# Patient Record
Sex: Female | Born: 1995
Health system: Southern US, Community
[De-identification: ages and names within clinical notes are randomized; demographics above are authoritative.]

## PROBLEM LIST (undated history)

## (undated) DIAGNOSIS — B029 Zoster without complications: Secondary | ICD-10-CM

## (undated) DIAGNOSIS — Y9321 Activity, ice skating: Secondary | ICD-10-CM

## (undated) DIAGNOSIS — D649 Anemia, unspecified: Secondary | ICD-10-CM

## (undated) HISTORY — DX: Zoster without complications: B02.9

## (undated) HISTORY — DX: Activity, ice skating: Y93.21

---

## 2017-03-20 ENCOUNTER — Ambulatory Visit (INDEPENDENT_AMBULATORY_CARE_PROVIDER_SITE_OTHER): Payer: BLUE CROSS/BLUE SHIELD

## 2017-03-20 ENCOUNTER — Ambulatory Visit (INDEPENDENT_AMBULATORY_CARE_PROVIDER_SITE_OTHER)
Admission: EM | Admit: 2017-03-20 | Discharge: 2017-03-20 | Disposition: A | Discharge status: Home / Self Care | Payer: BLUE CROSS/BLUE SHIELD | Source: Home / Self Care

## 2017-03-20 ENCOUNTER — Emergency Department (HOSPITAL_COMMUNITY): Payer: BLUE CROSS/BLUE SHIELD

## 2017-03-20 ENCOUNTER — Encounter (HOSPITAL_COMMUNITY): Payer: Self-pay | Admitting: Emergency Medicine

## 2017-03-20 ENCOUNTER — Encounter (HOSPITAL_COMMUNITY): Payer: Self-pay | Admitting: *Deleted

## 2017-03-20 ENCOUNTER — Emergency Department (HOSPITAL_COMMUNITY)
Admission: EM | Admit: 2017-03-20 | Discharge: 2017-03-21 | Disposition: A | Discharge status: Home / Self Care | Payer: BLUE CROSS/BLUE SHIELD | Attending: Emergency Medicine | Admitting: Emergency Medicine

## 2017-03-20 DIAGNOSIS — R109 Unspecified abdominal pain: Secondary | ICD-10-CM

## 2017-03-20 DIAGNOSIS — R110 Nausea: Secondary | ICD-10-CM | POA: Diagnosis not present

## 2017-03-20 DIAGNOSIS — R079 Chest pain, unspecified: Secondary | ICD-10-CM

## 2017-03-20 DIAGNOSIS — R0781 Pleurodynia: Secondary | ICD-10-CM | POA: Diagnosis not present

## 2017-03-20 DIAGNOSIS — N12 Tubulo-interstitial nephritis, not specified as acute or chronic: Secondary | ICD-10-CM

## 2017-03-20 DIAGNOSIS — R000 Tachycardia, unspecified: Secondary | ICD-10-CM

## 2017-03-20 DIAGNOSIS — E876 Hypokalemia: Secondary | ICD-10-CM | POA: Insufficient documentation

## 2017-03-20 DIAGNOSIS — R51 Headache: Secondary | ICD-10-CM | POA: Diagnosis not present

## 2017-03-20 DIAGNOSIS — M545 Low back pain: Secondary | ICD-10-CM | POA: Insufficient documentation

## 2017-03-20 DIAGNOSIS — D649 Anemia, unspecified: Secondary | ICD-10-CM | POA: Diagnosis not present

## 2017-03-20 DIAGNOSIS — R519 Headache, unspecified: Secondary | ICD-10-CM

## 2017-03-20 DIAGNOSIS — R1032 Left lower quadrant pain: Secondary | ICD-10-CM | POA: Diagnosis present

## 2017-03-20 HISTORY — DX: Anemia, unspecified: D64.9

## 2017-03-20 LAB — CBC WITH DIFFERENTIAL/PLATELET
BASOS ABS: 0 10*3/uL (ref 0.0–0.1)
BASOS PCT: 0 %
Eosinophils Absolute: 0 10*3/uL (ref 0.0–0.7)
Eosinophils Relative: 0 %
HEMATOCRIT: 36.8 % (ref 36.0–46.0)
HEMOGLOBIN: 12.1 g/dL (ref 12.0–15.0)
LYMPHS PCT: 7 %
Lymphs Abs: 1.3 10*3/uL (ref 0.7–4.0)
MCH: 27.9 pg (ref 26.0–34.0)
MCHC: 32.9 g/dL (ref 30.0–36.0)
MCV: 84.8 fL (ref 78.0–100.0)
Monocytes Absolute: 0.7 10*3/uL (ref 0.1–1.0)
Monocytes Relative: 4 %
NEUTROS ABS: 16.2 10*3/uL — AB (ref 1.7–7.7)
NEUTROS PCT: 89 %
Platelets: 206 10*3/uL (ref 150–400)
RBC: 4.34 MIL/uL (ref 3.87–5.11)
RDW: 13.8 % (ref 11.5–15.5)
WBC: 18.2 10*3/uL — ABNORMAL HIGH (ref 4.0–10.5)

## 2017-03-20 LAB — COMPREHENSIVE METABOLIC PANEL
ALBUMIN: 3.9 g/dL (ref 3.5–5.0)
ALK PHOS: 86 U/L (ref 38–126)
ALT: 16 U/L (ref 14–54)
ANION GAP: 10 (ref 5–15)
AST: 22 U/L (ref 15–41)
BUN: 5 mg/dL — ABNORMAL LOW (ref 6–20)
CALCIUM: 8.9 mg/dL (ref 8.9–10.3)
CO2: 24 mmol/L (ref 22–32)
Chloride: 100 mmol/L — ABNORMAL LOW (ref 101–111)
Creatinine, Ser: 0.73 mg/dL (ref 0.44–1.00)
GFR calc non Af Amer: >60 mL/min (ref >60)
Glucose, Bld: 125 mg/dL — ABNORMAL HIGH (ref 65–99)
POTASSIUM: 3 mmol/L — AB (ref 3.5–5.1)
SODIUM: 134 mmol/L — AB (ref 135–145)
TOTAL PROTEIN: 7.4 g/dL (ref 6.5–8.1)
Total Bilirubin: 0.8 mg/dL (ref 0.3–1.2)

## 2017-03-20 LAB — URINALYSIS, ROUTINE W REFLEX MICROSCOPIC
BILIRUBIN URINE: NEGATIVE
GLUCOSE, UA: NEGATIVE mg/dL
KETONES UR: 5 mg/dL — AB
NITRITE: NEGATIVE
PROTEIN: 100 mg/dL — AB
Specific Gravity, Urine: 1.021 (ref 1.005–1.030)
pH: 5 (ref 5.0–8.0)

## 2017-03-20 LAB — I-STAT BETA HCG BLOOD, ED (MC, WL, AP ONLY): I-stat hCG, quantitative: <5 m[IU]/mL (ref <5)

## 2017-03-20 LAB — I-STAT CG4 LACTIC ACID, ED: LACTIC ACID, VENOUS: 2.11 mmol/L — AB (ref 0.5–1.9)

## 2017-03-20 LAB — I-STAT TROPONIN, ED: TROPONIN I, POC: 0.01 ng/mL (ref 0.00–0.08)

## 2017-03-20 MED ORDER — IOPAMIDOL (ISOVUE-300) INJECTION 61%
INTRAVENOUS | Status: AC
  Administered 2017-03-21: 100 mL via INTRAVENOUS
  Filled 2017-03-20: qty 100

## 2017-03-20 MED ORDER — IBUPROFEN 800 MG PO TABS
800.0000 mg | ORAL_TABLET | Freq: Once | ORAL | Status: AC
  Administered 2017-03-20: 800 mg via ORAL
  Filled 2017-03-20: qty 1

## 2017-03-20 MED ORDER — CEFTRIAXONE SODIUM 1 G IJ SOLR
1.0000 g | Freq: Once | INTRAMUSCULAR | Status: AC
  Administered 2017-03-20: 1 g via INTRAVENOUS
  Filled 2017-03-20: qty 10

## 2017-03-20 MED ORDER — POTASSIUM CHLORIDE CRYS ER 20 MEQ PO TBCR
40.0000 meq | EXTENDED_RELEASE_TABLET | Freq: Once | ORAL | Status: AC
  Administered 2017-03-20: 40 meq via ORAL
  Filled 2017-03-20: qty 2

## 2017-03-20 MED ORDER — SODIUM CHLORIDE 0.9 % IV BOLUS (SEPSIS)
1000.0000 mL | Freq: Once | INTRAVENOUS | Status: AC
  Administered 2017-03-20: 1000 mL via INTRAVENOUS

## 2017-03-20 NOTE — ED Provider Notes (Signed)
[facility] Provider Note   CSN: [id number] Arrival date & time: 03/20/17  2039     History   Chief Complaint Chief Complaint  Patient presents with  . Tachycardia    HPI Kristy Weber is a 21 y.o. female.  The history is provided by the patient and medical records.     21 year old female with history of anemia, presenting to the ED from urgent care for tachycardia. Patient states she has had left side pain for about 3 days now. States pain is left lower side but radiates up into the rib. She denies any injury, trauma, or falls. States her back occasionally hurts as well. She was recently treated for UTI with a partial course of antibiotics, she finished this about 2-3 weeks ago (thinks it was keflex. States she does not have any current urinary symptoms. Reports she has been running fevers and having some mild chills.  She denies any nausea or vomiting. No diarrhea. She denies any chest pain or shortness of breath. She has no history of DVT or PE. She does get regular depo shots.  Past Medical History:  Diagnosis Date  . Anemia     There are no active problems to display for this patient.   History reviewed. No pertinent surgical history.  OB History    No data available       Home Medications    Prior to Admission medications   Not on File    Family History No family history on file.  Social History Social History  Substance Use Topics  . Smoking status: Never Smoker  . Smokeless tobacco: Never Used  . Alcohol use Not on file     Allergies   Patient has no known allergies.   Review of Systems Review of Systems  Gastrointestinal: Positive for abdominal pain.  Musculoskeletal: Positive for back pain.  All other systems reviewed and are negative.    Physical Exam Updated Vital Signs BP 105/72   Pulse (!) 121   Temp (S) (!) 101.5 F (38.6 C) (Oral)   Resp (!) 23   Ht 5\' 4"  (1.626 m)   Wt 65.8 kg (145 lb)   SpO2 99%   BMI 24.89  kg/m   Physical Exam  Constitutional: She is oriented to person, place, and time. She appears well-developed and well-nourished.  NAD, drinking water, texting on cell phone, non-toxic in appearance  HENT:  Head: Normocephalic and atraumatic.  Mouth/Throat: Oropharynx is clear and moist.  Eyes: Pupils are equal, round, and reactive to light. Conjunctivae and EOM are normal.  Neck: Normal range of motion.  Cardiovascular: Regular rhythm and normal heart sounds.  Tachycardia present.   Tachycardic around 120's during exam  Pulmonary/Chest: Effort normal and breath sounds normal.  Lungs clear, no distress, ribs are non-tender, no deformities  Abdominal: Soft. Bowel sounds are normal. There is tenderness. There is CVA tenderness.    Tenderness of left lateral abdomen as depicted, reports some radiation into the left ribs, no peritoneal signs, left CVA tenderness  Musculoskeletal: Normal range of motion.  Neurological: She is alert and oriented to person, place, and time.  Skin: Skin is warm and dry.  Psychiatric: She has a normal mood and affect.  Nursing note and vitals reviewed.    ED Treatments / Results  Labs (all labs ordered are listed, but only abnormal results are displayed) Labs Reviewed  COMPREHENSIVE METABOLIC PANEL - Abnormal; Notable for the following:       Result Value  Sodium 134 (*)    Potassium 3.0 (*)    Chloride 100 (*)    Glucose, Bld 125 (*)    BUN 5 (*)    All other components within normal limits  CBC WITH DIFFERENTIAL/PLATELET - Abnormal; Notable for the following:    WBC 18.2 (*)    Neutro Abs 16.2 (*)    All other components within normal limits  URINALYSIS, ROUTINE W REFLEX MICROSCOPIC - Abnormal; Notable for the following:    Color, Urine AMBER (*)    APPearance CLOUDY (*)    Hgb urine dipstick MODERATE (*)    Ketones, ur 5 (*)    Protein, ur 100 (*)    Leukocytes, UA LARGE (*)    Bacteria, UA MANY (*)    Squamous Epithelial / LPF 0-5 (*)      All other components within normal limits  I-STAT CG4 LACTIC ACID, ED - Abnormal; Notable for the following:    Lactic Acid, Venous 2.11 (*)    All other components within normal limits  CULTURE, BLOOD (ROUTINE X 2)  CULTURE, BLOOD (ROUTINE X 2)  URINE CULTURE  I-STAT TROPONIN, ED  I-STAT BETA HCG BLOOD, ED ([facility], [facility], [facility] ONLY)    EKG  EKG Interpretation None       Radiology Dg Chest 2 View  Result Date: 03/20/2017 CLINICAL DATA:  Left-sided chest pain for 2 weeks.  Worse today. EXAM: CHEST  2 VIEW COMPARISON:  March 20, 2017 FINDINGS: Probable nipple shadow over the right base. The heart, hila, mediastinum, lungs, and pleura are otherwise normal. No cause for chest pain identified. IMPRESSION: No active cardiopulmonary disease. Electronically Signed   By: Gerome Sam III M.D   On: 03/20/2017 21:49   Dg Ribs Unilateral W/chest Left  Result Date: 03/20/2017 CLINICAL DATA:  Mid left rib pain for 3 days.  No known injury. EXAM: LEFT RIBS AND CHEST - 3+ VIEW COMPARISON:  None. FINDINGS: Minimal atelectasis in the left base. The heart, hila, mediastinum, lungs, and pleura are otherwise normal. No pneumothorax. No rib abnormalities are identified to explain the patient's symptoms. IMPRESSION: No rib abnormalities identified. Minimal atelectasis in the left base. Electronically Signed   By: Gerome Sam III M.D   On: 03/20/2017 19:17    Procedures Procedures (including critical care time)  Medications Ordered in ED Medications  sodium chloride 0.9 % bolus 1,000 mL (not administered)  cefTRIAXone (ROCEPHIN) 1 g in dextrose 5 % 50 mL IVPB (not administered)  ibuprofen (ADVIL,MOTRIN) tablet 800 mg (not administered)  potassium chloride SA (K-DUR,KLOR-CON) CR tablet 40 mEq (not administered)     Initial Impression / Assessment and Plan / ED Course  I have reviewed the triage vital signs and the nursing notes.  Pertinent labs & imaging results that were available during my care  of the patient were reviewed by me and considered in my medical decision making (see chart for details).  21 year old female sent here from urgent care for tachycardia.Patient is febrile and tachycardic on arrival.she is overall nontoxic in appearance. Exam does reveal some left lateral abdominal tenderness as well as left CVA tenderness. I do not appreciate any rib tenderness or chest wall deformities. Her lungs are clear. Screening labs from triage with mildly elevated lactic acid at 2.11. White blood cell count of 18.9. K+ mildly low, replaced here.  UA appears infectious with many bacteria.chest x-ray was obtained which is negative. Will obtain blood and urine cultures. Patient given IV fluid bolus, started on  1 g Rocephin IV as I feel she likely has pyelonephritis. Will obtain CT scan with contrast to ensure no other acute abnormalities.  CT scan does confirm pyelonephritis, no other acute findings. Patient's heart rate has continued to trend down here. She has had some soft blood pressures but denies any lightheadedness or dizziness. She is tolerating oral fluids well this time. Will repeat lactic acid, given additional IV fluids and reassess.  2:04 AM  Lactate has cleared.  HR now WNL.  BP remains stable.  Fever controlled. Patient has continued tolerating oral fluids well.   No vomiting.  She would like to go home.  I do feel she is stable to be treated as an outpatient.  Blood and urine cultures have been sent, patient aware of this.  Will start on cipro, vicodin for pain.  Will have her follow-up closely with urology.  Patient was given strict return precautions for any new/worsening symptoms.  She was discharged home in stable condition.  Final Clinical Impressions(s) / ED Diagnoses   Final diagnoses:  Pyelonephritis  Flank pain  Hypokalemia    New Prescriptions New Prescriptions   CIPROFLOXACIN (CIPRO) 500 MG TABLET    Take 1 tablet (500 mg total) by mouth every 12 (twelve) hours.    HYDROCODONE-ACETAMINOPHEN (NORCO/VICODIN) 5-325 MG TABLET    Take 1 tablet by mouth every 4 (four) hours as needed.   ONDANSETRON (ZOFRAN ODT) 4 MG DISINTEGRATING TABLET    Take 1 tablet (4 mg total) by mouth every 8 (eight) hours as needed for nausea.     Garlon Hatchet, PA-C 03/21/17 1610    Lavera Guise, MD 03/21/17 [phone]

## 2017-03-20 NOTE — ED Provider Notes (Signed)
[facility]    CSN: [id number] Arrival date & time: 03/20/17  1719     History   Chief Complaint Chief Complaint  Patient presents with  . Rib Injury    HPI Kristy Weber is a 21 y.o. female.   21 year old female states that for the past 2 weeks she has had a left lateral chest pain. The pain is located along the left costal margin anteriorly laterally toward the posterior axillary line. This pain is sharp. Worse with movement, cough and taking a deep breath. No known trauma, no falls or injury. Relatively insidious onset. She states it was worse yesterday. Yesterday she developed headache which is around the forehead, temples and face. She has had nausea but no vomiting since yesterday and a decreased appetite since yesterday. No cough, no shortness of breath no anterior chest discomfort.      Past Medical History:  Diagnosis Date  . Anemia     There are no active problems to display for this patient.   History reviewed. No pertinent surgical history.  OB History    No data available       Home Medications    Prior to Admission medications   Not on File    Family History Mother and grandmother with hypertension. No known CAD. No known sudden deaths prior to age 38  Social History Social History  Substance Use Topics  . Smoking status: Never Smoker  . Smokeless tobacco: Never Used  . Alcohol use Not on file     Allergies   Patient has no known allergies.   Review of Systems Review of Systems  Constitutional: Positive for activity change, appetite change, chills and fever.  HENT: Negative for congestion and sore throat.        Denies nasal congestion and earache.  Respiratory: Negative for cough, shortness of breath and wheezing.   Cardiovascular: Positive for chest pain.  Gastrointestinal: Positive for nausea. Negative for abdominal pain and vomiting.  Genitourinary: Negative.   Musculoskeletal: Negative for back pain, neck pain and  neck stiffness.  Skin: Negative.   Neurological: Positive for headaches. Negative for tremors, seizures, syncope and speech difficulty.  Psychiatric/Behavioral: Negative.   All other systems reviewed and are negative.    Physical Exam Triage Vital Signs ED Triage Vitals  Enc Vitals Group     BP 03/20/17 1736 104/73     Pulse Rate 03/20/17 1736 (!) 137     Resp 03/20/17 1736 18     Temp 03/20/17 1736 99.9 F (37.7 C)     Temp Source 03/20/17 1736 Oral     SpO2 03/20/17 1736 100 %     Weight --      Height --      Head Circumference --      Peak Flow --      Pain Score 03/20/17 1734 6     Pain Loc --      Pain Edu? --      Excl. in GC? --    No data found.   Updated Vital Signs BP 104/73 (BP Location: Left Arm)   Pulse (!) 117   Temp 99.9 F (37.7 C) (Oral)   Resp 18   SpO2 100%   Visual Acuity Right Eye Distance:   Left Eye Distance:   Bilateral Distance:    Right Eye Near:   Left Eye Near:    Bilateral Near:     Physical Exam  Constitutional: She is oriented to person,  place, and time. She appears well-developed and well-nourished. No distress.  HENT:  Head: Normocephalic and atraumatic.  Mouth/Throat: Oropharynx is clear and moist.  Eyes: EOM are normal.  Neck: Normal range of motion. Neck supple.  Cardiovascular: Regular rhythm, normal heart sounds and intact distal pulses.   Apical tachycardia at 134  Pulmonary/Chest: Effort normal and breath sounds normal. No respiratory distress. She has no wheezes. She has no rales. She exhibits tenderness.  Palpation of the lower most anterior lateral and a portion of the posterior costal margin there is marked tenderness. Having the patient lie supine and then sit up also reproduces the pain. Taking a deep breath reproduces the pain. No radiation. Lungs clear  Abdominal: Soft. Bowel sounds are normal. She exhibits no distension and no mass. There is no tenderness. There is no rebound and no guarding.    Musculoskeletal: Normal range of motion. She exhibits no edema or deformity.  Lymphadenopathy:    She has no cervical adenopathy.  Neurological: She is alert and oriented to person, place, and time. No cranial nerve deficit. Coordination normal.  Skin: Skin is warm and dry.  Psychiatric: She has a normal mood and affect.  Nursing note and vitals reviewed.    UC Treatments / Results  Labs (all labs ordered are listed, but only abnormal results are displayed) Labs Reviewed - No data to display  EKG  EKG Interpretation None     ED ECG REPORT   Date: 03/20/2017  Rate:   Rhythm: sinus tachycardia  QRS Axis: normal  Intervals: normal  ST/T Wave abnormalities: nonspecific T wave changes  Conduction Disutrbances:none  Narrative Interpretation: Q waves in 2, 3 and aVF inverted T waves and 3 and aVF  Old EKG Reviewed: none available  I have personally reviewed the EKG tracing and agree with the computerized printout as noted.   Radiology Dg Ribs Unilateral W/chest Left  Result Date: 03/20/2017 CLINICAL DATA:  Mid left rib pain for 3 days.  No known injury. EXAM: LEFT RIBS AND CHEST - 3+ VIEW COMPARISON:  None. FINDINGS: Minimal atelectasis in the left base. The heart, hila, mediastinum, lungs, and pleura are otherwise normal. No pneumothorax. No rib abnormalities are identified to explain the patient's symptoms. IMPRESSION: No rib abnormalities identified. Minimal atelectasis in the left base. Electronically Signed   By: Gerome Sam III M.D   On: 03/20/2017 19:17    Procedures Procedures (including critical care time)  Medications Ordered in UC Medications - No data to display   Initial Impression / Assessment and Plan / UC Course  I have reviewed the triage vital signs and the nursing notes.  Pertinent labs & imaging results that were available during my care of the patient were reviewed by me and considered in my medical decision making (see chart for  details).  Clinical Course as of Mar 21 1999  Mon Mar 20, 2017  1942 DG Ribs Unilateral W/Chest Left [DM]    Clinical Course User Index [DM] Hayden Rasmussen, NP      Final Clinical Impressions(s) / UC Diagnoses   Final diagnoses:  Sinus tachycardia  Acute nonintractable headache, unspecified headache type  Left sided chest pain  Costal margin pain  Due to the unexplained reason for sinus tachycardia associated with the chest pain, sudden onset patient is being sent to emergency department for evaluation. Consulted with Dr. Dayton Scrape prior to transfer and she agrees.  New Prescriptions New Prescriptions   No medications on file     Controlled  Substance Prescriptions [place] Controlled Substance Registry consulted? Not Applicable   Hayden Rasmussen, NP 03/20/17 2003

## 2017-03-20 NOTE — ED Notes (Signed)
Sent from [facility] for tachycardia. Pt has had L sided rib cage pain X2 weeks along with HA. Denies cough, denies feeling of heart racing. Pt states she just finished up tx for UTI. Noted to have low grade fever in triage, HR 130's.

## 2017-03-20 NOTE — ED Triage Notes (Addendum)
Patient reports left sided rib cage pain x 2 weeks. Does not radiate. Patient reports pain with deep inspiration and movement. Patient denies injury. Patient reports intermittent chills and headache.

## 2017-03-20 NOTE — ED Notes (Signed)
NP notified of HR

## 2017-03-20 NOTE — Discharge Instructions (Signed)
To go to [place] for evaluation of rapid heart rate and chest pain.

## 2017-03-21 ENCOUNTER — Emergency Department (HOSPITAL_COMMUNITY): Payer: BLUE CROSS/BLUE SHIELD

## 2017-03-21 LAB — I-STAT CG4 LACTIC ACID, ED: Lactic Acid, Venous: 1.65 mmol/L (ref 0.5–1.9)

## 2017-03-21 MED ORDER — SODIUM CHLORIDE 0.9 % IV BOLUS (SEPSIS)
1000.0000 mL | Freq: Once | INTRAVENOUS | Status: AC
  Administered 2017-03-21: 1000 mL via INTRAVENOUS

## 2017-03-21 MED ORDER — HYDROCODONE-ACETAMINOPHEN 5-325 MG PO TABS: 1.0000 | ORAL_TABLET | ORAL | 0 refills | Status: DC | PRN

## 2017-03-21 MED ORDER — ONDANSETRON 4 MG PO TBDP: 4.0000 mg | ORAL_TABLET | Freq: Three times a day (TID) | ORAL | 0 refills | Status: DC | PRN

## 2017-03-21 MED ORDER — CIPROFLOXACIN HCL 500 MG PO TABS: 500.0000 mg | ORAL_TABLET | Freq: Two times a day (BID) | ORAL | 0 refills | Status: DC

## 2017-03-21 NOTE — ED Notes (Signed)
Pt departed in NAD, refused use of wheelchair.  

## 2017-03-21 NOTE — ED Notes (Signed)
Patient transported to CT 

## 2017-03-21 NOTE — Discharge Instructions (Signed)
Take the prescribed medication as directed.  Can continue tylenol or motrin for fever.  Do not take more than 2g of tylenol from all sources (vicodin has tylenol in it). Follow-up with urology-- can call to make an appt. Return to the ED for new or worsening symptoms--- worsening pain, uncontrolled vomiting, trouble taking your medications, etc.

## 2017-03-23 LAB — URINE CULTURE: Culture: >=100000 — AB

## 2017-03-24 ENCOUNTER — Telehealth: Payer: Self-pay | Admitting: *Deleted

## 2017-03-24 NOTE — Telephone Encounter (Signed)
Post ED Visit - Positive Culture Follow-up  Culture report reviewed by antimicrobial stewardship pharmacist:  []  Enzo Bi, Pharm.D. []  Celedonio Miyamoto, [address].D., BCPS AQ-ID []  Garvin Fila, Pharm.D., BCPS []  Georgina Pillion, Pharm.D., BCPS []  [place], [place].D., BCPS, AAHIVP []  Estella Husk, Pharm.D., BCPS, AAHIVP []  Lysle Pearl, PharmD, BCPS []  Casilda Carls, PharmD, BCPS []  Pollyann Samples, PharmD, BCPS Sharin Mons, PharmD  Positive urine culture Treated with Ciprofloxacin HCL, organism sensitive to the same and no further patient follow-up is required at this time.  Virl Axe [facility] 03/24/2017, 10:16 AM

## 2017-03-26 LAB — CULTURE, BLOOD (ROUTINE X 2)
Culture: NO GROWTH
Culture: NO GROWTH
SPECIAL REQUESTS: ADEQUATE
Special Requests: ADEQUATE

## 2018-09-14 DIAGNOSIS — Z3042 Encounter for surveillance of injectable contraceptive: Secondary | ICD-10-CM | POA: Diagnosis not present

## 2018-11-26 DIAGNOSIS — Z3042 Encounter for surveillance of injectable contraceptive: Secondary | ICD-10-CM | POA: Diagnosis not present

## 2018-12-17 DIAGNOSIS — J0100 Acute maxillary sinusitis, unspecified: Secondary | ICD-10-CM | POA: Diagnosis not present

## 2019-01-11 DIAGNOSIS — J320 Chronic maxillary sinusitis: Secondary | ICD-10-CM | POA: Diagnosis not present

## 2019-01-11 DIAGNOSIS — H6983 Other specified disorders of Eustachian tube, bilateral: Secondary | ICD-10-CM | POA: Diagnosis not present

## 2019-01-11 DIAGNOSIS — H9203 Otalgia, bilateral: Secondary | ICD-10-CM | POA: Diagnosis not present

## 2019-01-11 DIAGNOSIS — J358 Other chronic diseases of tonsils and adenoids: Secondary | ICD-10-CM | POA: Diagnosis not present

## 2019-01-11 DIAGNOSIS — J310 Chronic rhinitis: Secondary | ICD-10-CM | POA: Diagnosis not present

## 2019-01-25 DIAGNOSIS — Z114 Encounter for screening for human immunodeficiency virus [HIV]: Secondary | ICD-10-CM | POA: Diagnosis not present

## 2019-01-25 DIAGNOSIS — Z113 Encounter for screening for infections with a predominantly sexual mode of transmission: Secondary | ICD-10-CM | POA: Diagnosis not present

## 2019-01-31 DIAGNOSIS — J3089 Other allergic rhinitis: Secondary | ICD-10-CM | POA: Diagnosis not present

## 2019-01-31 DIAGNOSIS — J320 Chronic maxillary sinusitis: Secondary | ICD-10-CM | POA: Diagnosis not present

## 2019-01-31 DIAGNOSIS — J358 Other chronic diseases of tonsils and adenoids: Secondary | ICD-10-CM | POA: Diagnosis not present

## 2019-01-31 DIAGNOSIS — J351 Hypertrophy of tonsils: Secondary | ICD-10-CM | POA: Diagnosis not present

## 2019-02-05 DIAGNOSIS — Z3042 Encounter for surveillance of injectable contraceptive: Secondary | ICD-10-CM | POA: Diagnosis not present

## 2019-04-02 DIAGNOSIS — N898 Other specified noninflammatory disorders of vagina: Secondary | ICD-10-CM | POA: Diagnosis not present

## 2019-04-02 DIAGNOSIS — R350 Frequency of micturition: Secondary | ICD-10-CM | POA: Diagnosis not present

## 2019-04-16 DIAGNOSIS — Z113 Encounter for screening for infections with a predominantly sexual mode of transmission: Secondary | ICD-10-CM | POA: Diagnosis not present

## 2019-04-16 DIAGNOSIS — Z3042 Encounter for surveillance of injectable contraceptive: Secondary | ICD-10-CM | POA: Diagnosis not present

## 2019-05-26 DIAGNOSIS — S29012A Strain of muscle and tendon of back wall of thorax, initial encounter: Secondary | ICD-10-CM | POA: Diagnosis not present

## 2019-05-29 ENCOUNTER — Emergency Department (HOSPITAL_COMMUNITY)
Admission: EM | Admit: 2019-05-29 | Discharge: 2019-05-30 | Discharge status: Against Medical Advice | Payer: BC Managed Care – PPO | Attending: Emergency Medicine | Admitting: Emergency Medicine

## 2019-05-29 ENCOUNTER — Encounter (HOSPITAL_COMMUNITY): Payer: Self-pay | Admitting: Emergency Medicine

## 2019-05-29 DIAGNOSIS — Z5321 Procedure and treatment not carried out due to patient leaving prior to being seen by health care provider: Secondary | ICD-10-CM | POA: Diagnosis not present

## 2019-05-29 DIAGNOSIS — R252 Cramp and spasm: Secondary | ICD-10-CM | POA: Diagnosis not present

## 2019-05-29 LAB — URINALYSIS, ROUTINE W REFLEX MICROSCOPIC
Bilirubin Urine: NEGATIVE
Glucose, UA: NEGATIVE mg/dL
Ketones, ur: 5 mg/dL — AB
Leukocytes,Ua: NEGATIVE
Nitrite: NEGATIVE
Protein, ur: 30 mg/dL — AB
Specific Gravity, Urine: 1.027 (ref 1.005–1.030)
pH: 5 (ref 5.0–8.0)

## 2019-05-29 NOTE — ED Triage Notes (Signed)
Pt here with c/o right lower back pain with rad down the leg. She went to UC and given a muscle relaxer but broke out. Ambulatory.

## 2019-05-30 LAB — COMPREHENSIVE METABOLIC PANEL
ALT: 17 U/L (ref 0–44)
AST: 25 U/L (ref 15–41)
Albumin: 4.2 g/dL (ref 3.5–5.0)
Alkaline Phosphatase: 79 U/L (ref 38–126)
Anion gap: 11 (ref 5–15)
BUN: 9 mg/dL (ref 6–20)
CO2: 22 mmol/L (ref 22–32)
Calcium: 9.3 mg/dL (ref 8.9–10.3)
Chloride: 106 mmol/L (ref 98–111)
Creatinine, Ser: 0.75 mg/dL (ref 0.44–1.00)
GFR calc Af Amer: >60 mL/min (ref >60)
GFR calc non Af Amer: >60 mL/min (ref >60)
Glucose, Bld: 99 mg/dL (ref 70–99)
Potassium: 3.7 mmol/L (ref 3.5–5.1)
Sodium: 139 mmol/L (ref 135–145)
Total Bilirubin: 0.2 mg/dL — ABNORMAL LOW (ref 0.3–1.2)
Total Protein: 7.3 g/dL (ref 6.5–8.1)

## 2019-05-30 LAB — CBC
HCT: 41.6 % (ref 36.0–46.0)
Hemoglobin: 13.5 g/dL (ref 12.0–15.0)
MCH: 27.2 pg (ref 26.0–34.0)
MCHC: 32.5 g/dL (ref 30.0–36.0)
MCV: 83.9 fL (ref 80.0–100.0)
Platelets: 200 10*3/uL (ref 150–400)
RBC: 4.96 MIL/uL (ref 3.87–5.11)
RDW: 13 % (ref 11.5–15.5)
WBC: 7.2 10*3/uL (ref 4.0–10.5)
nRBC: 0 % (ref 0.0–0.2)

## 2019-05-30 LAB — CK: Total CK: 164 U/L (ref 38–234)

## 2019-05-30 NOTE — ED Notes (Signed)
Pt called for vitals no answer. °

## 2019-06-01 DIAGNOSIS — B029 Zoster without complications: Secondary | ICD-10-CM | POA: Diagnosis not present

## 2019-06-01 DIAGNOSIS — M549 Dorsalgia, unspecified: Secondary | ICD-10-CM | POA: Diagnosis not present

## 2019-06-01 DIAGNOSIS — M79604 Pain in right leg: Secondary | ICD-10-CM | POA: Diagnosis not present

## 2019-06-20 DIAGNOSIS — Z20828 Contact with and (suspected) exposure to other viral communicable diseases: Secondary | ICD-10-CM | POA: Diagnosis not present

## 2019-06-25 DIAGNOSIS — Z3042 Encounter for surveillance of injectable contraceptive: Secondary | ICD-10-CM | POA: Diagnosis not present

## 2019-07-12 DIAGNOSIS — Z113 Encounter for screening for infections with a predominantly sexual mode of transmission: Secondary | ICD-10-CM | POA: Diagnosis not present

## 2019-07-12 DIAGNOSIS — Z114 Encounter for screening for human immunodeficiency virus [HIV]: Secondary | ICD-10-CM | POA: Diagnosis not present

## 2019-09-06 DIAGNOSIS — Z30013 Encounter for initial prescription of injectable contraceptive: Secondary | ICD-10-CM | POA: Diagnosis not present

## 2019-09-24 ENCOUNTER — Encounter: Payer: Self-pay | Admitting: Nurse Practitioner

## 2019-09-24 ENCOUNTER — Other Ambulatory Visit: Payer: Self-pay

## 2019-09-24 ENCOUNTER — Ambulatory Visit: Payer: BC Managed Care – PPO | Admitting: Nurse Practitioner

## 2019-09-24 VITALS — BP 116/80 | HR 64 | Temp 98.8°F | Ht 64.0 in | Wt 155.2 lb

## 2019-09-24 DIAGNOSIS — Z13228 Encounter for screening for other metabolic disorders: Secondary | ICD-10-CM

## 2019-09-24 DIAGNOSIS — R350 Frequency of micturition: Secondary | ICD-10-CM

## 2019-09-24 LAB — POCT URINALYSIS DIPSTICK
Bilirubin, UA: NEGATIVE
Glucose, UA: NEGATIVE
Ketones, UA: NEGATIVE
Leukocytes, UA: NEGATIVE
Nitrite, UA: NEGATIVE
Protein, UA: NEGATIVE
Spec Grav, UA: >=1.03 — AB (ref 1.010–1.025)
Urobilinogen, UA: 0.2 E.U./dL
pH, UA: 6.5 (ref 5.0–8.0)

## 2019-09-24 MED ORDER — NITROFURANTOIN MONOHYD MACRO 100 MG PO CAPS: 100.0000 mg | ORAL_CAPSULE | Freq: Two times a day (BID) | ORAL | 0 refills | Status: AC

## 2019-09-24 NOTE — Progress Notes (Signed)
This visit occurred during the SARS-CoV-2 public health emergency.  Safety protocols were in place, including screening questions prior to the visit, additional usage of staff PPE, and extensive cleaning of exam room while observing appropriate contact time as indicated for disinfecting solutions.  Subjective:     Patient ID: Kristy Weber , female    DOB: 03-Feb-1996 , 24 y.o.   MRN: [id number]   Chief Complaint  Patient presents with  . Establish Care  . Rash  . Urinary Frequency    patient stated her syptoms started about a month ago. she has pressure    HPI  Here to establish care - her older cousin referred her.  She had been going to urgent care.  She is from [facility] - graduated from 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  She is working at [employer].  Single. No children.    PMH - shingles last year (on her right leg).  Previous history of anemia.  Seasonal allergies. She had kidney infection in 2017 which hospitalized.  LMP - 2014 - on Depo injection.  [facility] [address]   She also has pain in her left side.   She took amoxicillin from the dentist she thought would help.     Urinary Frequency  This is a new problem. The current episode started more than 1 month ago (1.5 month ago). Quality: pressure. Associated symptoms include frequency and nausea (couple days ago). Pertinent negatives include no chills, flank pain, hematuria or urgency. She has tried antibiotics for the symptoms. There is no history of recurrent UTIs. 2017 hospitalized for kidney infection     Past Medical History:  Diagnosis Date  . Anemia   . Figure skating (singles) (pairs)   . Shingles      Family History  Problem Relation Age of Onset  . Diabetes Mother   . Sarcoidosis Mother   . Diabetes Maternal Grandmother   . Dementia Maternal Grandmother      Current Outpatient Medications:   .  medroxyPROGESTERone (DEPO-PROVERA) 150 MG/ML injection, Inject 150 mg into the muscle every 3 (three) months., Disp: , Rfl:  .  montelukast (SINGULAIR) 10 MG tablet, Take 10 mg by mouth at bedtime., Disp: , Rfl:    No Known Allergies   Review of Systems  Constitutional: Negative for chills.  Gastrointestinal: Positive for nausea (couple days ago). Negative for abdominal distention.  Genitourinary: Positive for frequency. Negative for flank pain, hematuria and urgency.  Musculoskeletal: Negative.   Neurological: Negative.   Hematological: Negative.   Psychiatric/Behavioral: Negative.      Today's Vitals   09/24/19 1141  BP: 116/80  Pulse: 64  Temp: 98.8 F (37.1 C)  TempSrc: Oral  Weight: 155 lb 3.2 oz (70.4 kg)  Height: 5' 4"  (1.626 m)  PainSc: 0-No pain   Body mass index is 26.64 kg/m.   Objective:  Physical Exam Constitutional:      Appearance: Normal appearance.  Cardiovascular:     Rate and Rhythm: Normal rate and regular rhythm.  Pulmonary:     Effort: Pulmonary effort is normal. No respiratory distress.     Breath sounds: Normal breath sounds.  Skin:    Capillary Refill: Capillary refill takes less than 2 seconds.  Neurological:     General: No focal deficit present.     Mental Status: She is alert and oriented to person, place, and time.  Psychiatric:        Mood and Affect: Mood normal.        Behavior: Behavior normal.        Thought Content: Thought content normal.        Judgment: Judgment normal.         Assessment And Plan:     1. Urinary frequency  Since she is symptomatic will treat with nitrofuratoin and send urine for culture - POCT Urinalysis Dipstick (81002) - Hemoglobin A1c - CMP14+EGFR - CBC - Culture, Urine - nitrofurantoin, macrocrystal-monohydrate, (MACROBID) 100 MG capsule; Take 1 capsule (100 mg total) by mouth 2 (two) times daily for 5 days.  Dispense: 10 capsule; Refill: 0  2. Encounter for screening for metabolic  disorder  New to the practice and will check for metabolic disorders         Minette Brine, FNP    THE PATIENT IS ENCOURAGED TO PRACTICE SOCIAL DISTANCING DUE TO THE COVID-19 PANDEMIC.

## 2019-09-25 LAB — CMP14+EGFR
ALT: 7 IU/L (ref 0–32)
AST: 18 IU/L (ref 0–40)
Albumin/Globulin Ratio: 1.6 (ref 1.2–2.2)
Albumin: 4.4 g/dL (ref 3.9–5.0)
Alkaline Phosphatase: 84 IU/L (ref 39–117)
BUN/Creatinine Ratio: 17 (ref 9–23)
BUN: 12 mg/dL (ref 6–20)
Bilirubin Total: 0.3 mg/dL (ref 0.0–1.2)
CO2: 22 mmol/L (ref 20–29)
Calcium: 9.3 mg/dL (ref 8.7–10.2)
Chloride: 105 mmol/L (ref 96–106)
Creatinine, Ser: 0.69 mg/dL (ref 0.57–1.00)
GFR calc Af Amer: 142 mL/min/{1.73_m2} (ref >59)
GFR calc non Af Amer: 123 mL/min/{1.73_m2} (ref >59)
Globulin, Total: 2.7 g/dL (ref 1.5–4.5)
Glucose: 78 mg/dL (ref 65–99)
Potassium: 4.3 mmol/L (ref 3.5–5.2)
Sodium: 142 mmol/L (ref 134–144)
Total Protein: 7.1 g/dL (ref 6.0–8.5)

## 2019-09-25 LAB — CBC
Hematocrit: 39.8 % (ref 34.0–46.6)
Hemoglobin: 12.9 g/dL (ref 11.1–15.9)
MCH: 26.7 pg (ref 26.6–33.0)
MCHC: 32.4 g/dL (ref 31.5–35.7)
MCV: 82 fL (ref 79–97)
Platelets: 246 10*3/uL (ref 150–450)
RBC: 4.84 x10E6/uL (ref 3.77–5.28)
RDW: 12.8 % (ref 11.7–15.4)
WBC: 6.3 10*3/uL (ref 3.4–10.8)

## 2019-09-25 LAB — HEMOGLOBIN A1C
Est. average glucose Bld gHb Est-mCnc: 105 mg/dL
Hgb A1c MFr Bld: 5.3 % (ref 4.8–5.6)

## 2019-09-25 LAB — URINE CULTURE

## 2019-10-16 DIAGNOSIS — Z114 Encounter for screening for human immunodeficiency virus [HIV]: Secondary | ICD-10-CM | POA: Diagnosis not present

## 2019-10-16 DIAGNOSIS — Z113 Encounter for screening for infections with a predominantly sexual mode of transmission: Secondary | ICD-10-CM | POA: Diagnosis not present

## 2019-11-08 DIAGNOSIS — N390 Urinary tract infection, site not specified: Secondary | ICD-10-CM | POA: Diagnosis not present

## 2019-11-11 ENCOUNTER — Ambulatory Visit: Payer: BC Managed Care – PPO | Admitting: Nurse Practitioner

## 2019-12-24 ENCOUNTER — Encounter: Payer: Self-pay | Admitting: Nurse Practitioner

## 2019-12-24 ENCOUNTER — Other Ambulatory Visit: Payer: Self-pay

## 2019-12-24 ENCOUNTER — Ambulatory Visit: Payer: BC Managed Care – PPO | Admitting: Nurse Practitioner

## 2019-12-24 VITALS — BP 118/72 | HR 66 | Temp 98.1°F | Ht 64.8 in | Wt 150.8 lb

## 2019-12-24 DIAGNOSIS — R350 Frequency of micturition: Secondary | ICD-10-CM

## 2019-12-24 LAB — POCT URINALYSIS DIPSTICK
Bilirubin, UA: NEGATIVE
Glucose, UA: NEGATIVE
Ketones, UA: NEGATIVE
Leukocytes, UA: NEGATIVE
Nitrite, UA: NEGATIVE
Protein, UA: NEGATIVE
Spec Grav, UA: >=1.03 — AB (ref 1.010–1.025)
Urobilinogen, UA: 0.2 E.U./dL
pH, UA: 5.5 (ref 5.0–8.0)

## 2019-12-24 NOTE — Progress Notes (Signed)
This visit occurred during the SARS-CoV-2 public health emergency.  Safety protocols were in place, including screening questions prior to the visit, additional usage of staff PPE, and extensive cleaning of exam room while observing appropriate contact time as indicated for disinfecting solutions.  Subjective:     Patient ID: Kristy Weber , female    DOB: 1995/09/30 , 24 y.o.   MRN: [id number]   Chief Complaint  Patient presents with  . Urinary Tract Infection    UTI f/u    HPI  She has had 3-4 urinary tract infections in the last 6 months.  She does not recall having any symptoms.  Intermittent left lateral side pain this is how she felt when she had a kidney infection in 2017.    Urinary Tract Infection  This is a recurrent problem. There has been no fever. She is not sexually active. There is a history of pyelonephritis. Associated symptoms include frequency. Pertinent negatives include no chills, flank pain, nausea, urgency or vomiting. She has tried nothing for the symptoms. There is no history of recurrent UTIs.     Past Medical History:  Diagnosis Date  . Anemia   . Figure skating (singles) (pairs)   . Shingles      Family History  Problem Relation Age of Onset  . Diabetes Mother   . Sarcoidosis Mother   . Diabetes Maternal Grandmother   . Dementia Maternal Grandmother      Current Outpatient Medications:  .  montelukast (SINGULAIR) 10 MG tablet, Take 10 mg by mouth at bedtime., Disp: , Rfl:  .  medroxyPROGESTERone (DEPO-PROVERA) 150 MG/ML injection, Inject 150 mg into the muscle every 3 (three) months., Disp: , Rfl:    No Known Allergies   Review of Systems  Constitutional: Negative for chills.  Gastrointestinal: Negative for nausea and vomiting.  Genitourinary: Positive for frequency. Negative for flank pain and urgency.  Musculoskeletal:       Left latereal area tender to touch at rib #11-12  Neurological: Negative for dizziness and headaches.      Today's Vitals   12/24/19 1058  BP: 118/72  Pulse: 66  Temp: 98.1 F (36.7 C)  TempSrc: Oral  Weight: 150 lb 12.8 oz (68.4 kg)  Height: 5' 4.8" (1.646 m)   Body mass index is 25.25 kg/m.   Objective:  Physical Exam Constitutional:      General: She is not in acute distress.    Appearance: Normal appearance.  Cardiovascular:     Rate and Rhythm: Normal rate and regular rhythm.     Pulses: Normal pulses.     Heart sounds: Normal heart sounds. No murmur.  Pulmonary:     Effort: Pulmonary effort is normal. No respiratory distress.     Breath sounds: Normal breath sounds.  Skin:    Capillary Refill: Capillary refill takes less than 2 seconds.  Neurological:     General: No focal deficit present.     Mental Status: She is alert and oriented to person, place, and time.  Psychiatric:        Mood and Affect: Mood normal.        Behavior: Behavior normal.        Thought Content: Thought content normal.        Judgment: Judgment normal.         Assessment And Plan:     1. Urinary frequency  Trace blood in her urine.  She reports a history of blood in urine and frequent urinary  tract infections  I educated on proper wiping for a female  I will refer to urology for further evaluation - POCT Urinalysis Dipstick (81002)        Arnette Felts, FNP    THE PATIENT IS ENCOURAGED TO PRACTICE SOCIAL DISTANCING DUE TO THE COVID-19 PANDEMIC.

## 2020-01-22 DIAGNOSIS — Z30011 Encounter for initial prescription of contraceptive pills: Secondary | ICD-10-CM | POA: Diagnosis not present

## 2020-01-24 DIAGNOSIS — Z113 Encounter for screening for infections with a predominantly sexual mode of transmission: Secondary | ICD-10-CM | POA: Diagnosis not present

## 2020-01-24 DIAGNOSIS — Z114 Encounter for screening for human immunodeficiency virus [HIV]: Secondary | ICD-10-CM | POA: Diagnosis not present

## 2020-02-28 DIAGNOSIS — R350 Frequency of micturition: Secondary | ICD-10-CM | POA: Diagnosis not present

## 2020-03-26 ENCOUNTER — Other Ambulatory Visit: Payer: Self-pay

## 2020-03-26 ENCOUNTER — Encounter (HOSPITAL_COMMUNITY): Payer: Self-pay

## 2020-03-26 ENCOUNTER — Emergency Department (HOSPITAL_COMMUNITY)
Admission: EM | Admit: 2020-03-26 | Discharge: 2020-03-26 | Disposition: A | Discharge status: Home / Self Care | Payer: BC Managed Care – PPO | Attending: Emergency Medicine | Admitting: Emergency Medicine

## 2020-03-26 DIAGNOSIS — Z5321 Procedure and treatment not carried out due to patient leaving prior to being seen by health care provider: Secondary | ICD-10-CM | POA: Diagnosis not present

## 2020-03-26 DIAGNOSIS — J029 Acute pharyngitis, unspecified: Secondary | ICD-10-CM | POA: Diagnosis not present

## 2020-03-26 DIAGNOSIS — N390 Urinary tract infection, site not specified: Secondary | ICD-10-CM | POA: Diagnosis not present

## 2020-03-26 DIAGNOSIS — J039 Acute tonsillitis, unspecified: Secondary | ICD-10-CM | POA: Diagnosis not present

## 2020-03-26 LAB — URINALYSIS, ROUTINE W REFLEX MICROSCOPIC
Bacteria, UA: NONE SEEN
Bilirubin Urine: NEGATIVE
Glucose, UA: NEGATIVE mg/dL
Ketones, ur: 5 mg/dL — AB
Leukocytes,Ua: NEGATIVE
Nitrite: NEGATIVE
Protein, ur: 30 mg/dL — AB
Specific Gravity, Urine: 1.027 (ref 1.005–1.030)
pH: 6 (ref 5.0–8.0)

## 2020-03-26 NOTE — ED Notes (Signed)
Called for vitals and no response 

## 2020-03-26 NOTE — ED Notes (Signed)
Called pt x2 for vitals, no response. °

## 2020-03-26 NOTE — ED Triage Notes (Signed)
Pt presents with multiple complaints, recently treated for a UTI 2 -3 weeks ago, pt does not feel like the ABX worked. Pt also reports "tonsilitis" x2 days, states she gets this all the time

## 2020-05-25 ENCOUNTER — Encounter: Payer: BC Managed Care – PPO | Admitting: Nurse Practitioner

## 2020-07-13 DIAGNOSIS — Z113 Encounter for screening for infections with a predominantly sexual mode of transmission: Secondary | ICD-10-CM | POA: Diagnosis not present

## 2020-07-13 DIAGNOSIS — Z114 Encounter for screening for human immunodeficiency virus [HIV]: Secondary | ICD-10-CM | POA: Diagnosis not present

## 2020-07-13 DIAGNOSIS — N390 Urinary tract infection, site not specified: Secondary | ICD-10-CM | POA: Diagnosis not present

## 2020-09-03 ENCOUNTER — Other Ambulatory Visit: Payer: Self-pay

## 2020-09-03 ENCOUNTER — Emergency Department (HOSPITAL_COMMUNITY)
Admission: EM | Admit: 2020-09-03 | Discharge: 2020-09-03 | Disposition: A | Discharge status: Home / Self Care | Payer: BC Managed Care – PPO | Attending: Emergency Medicine | Admitting: Emergency Medicine

## 2020-09-03 ENCOUNTER — Emergency Department (HOSPITAL_COMMUNITY): Payer: BC Managed Care – PPO

## 2020-09-03 ENCOUNTER — Encounter (HOSPITAL_COMMUNITY): Payer: Self-pay | Admitting: Emergency Medicine

## 2020-09-03 DIAGNOSIS — S39012A Strain of muscle, fascia and tendon of lower back, initial encounter: Secondary | ICD-10-CM

## 2020-09-03 DIAGNOSIS — Y92410 Unspecified street and highway as the place of occurrence of the external cause: Secondary | ICD-10-CM | POA: Diagnosis not present

## 2020-09-03 DIAGNOSIS — M545 Low back pain, unspecified: Secondary | ICD-10-CM | POA: Diagnosis not present

## 2020-09-03 DIAGNOSIS — R079 Chest pain, unspecified: Secondary | ICD-10-CM | POA: Diagnosis not present

## 2020-09-03 DIAGNOSIS — S3992XA Unspecified injury of lower back, initial encounter: Secondary | ICD-10-CM | POA: Diagnosis not present

## 2020-09-03 LAB — URINALYSIS, ROUTINE W REFLEX MICROSCOPIC
Bilirubin Urine: NEGATIVE
Glucose, UA: NEGATIVE mg/dL
Ketones, ur: 20 mg/dL — AB
Leukocytes,Ua: NEGATIVE
Nitrite: NEGATIVE
Protein, ur: NEGATIVE mg/dL
Specific Gravity, Urine: 1.027 (ref 1.005–1.030)
pH: 5 (ref 5.0–8.0)

## 2020-09-03 LAB — PREGNANCY, URINE: Preg Test, Ur: NEGATIVE

## 2020-09-03 MED ORDER — IBUPROFEN 600 MG PO TABS: 600.0000 mg | ORAL_TABLET | Freq: Four times a day (QID) | ORAL | 0 refills | Status: AC | PRN
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
## 2020-09-03 NOTE — ED Notes (Signed)
Patient transported to X-ray 

## 2020-09-03 NOTE — ED Notes (Signed)
ED Provider at bedside. 
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
## 2020-09-23 DIAGNOSIS — R350 Frequency of micturition: Secondary | ICD-10-CM | POA: Diagnosis not present

## 2020-09-23 DIAGNOSIS — N139 Obstructive and reflux uropathy, unspecified: Secondary | ICD-10-CM | POA: Diagnosis not present

## 2020-09-30 DIAGNOSIS — N390 Urinary tract infection, site not specified: Secondary | ICD-10-CM | POA: Diagnosis not present

## 2020-10-22 DIAGNOSIS — N390 Urinary tract infection, site not specified: Secondary | ICD-10-CM | POA: Diagnosis not present

## 2020-10-22 DIAGNOSIS — Z113 Encounter for screening for infections with a predominantly sexual mode of transmission: Secondary | ICD-10-CM | POA: Diagnosis not present

## 2020-10-22 DIAGNOSIS — Z3202 Encounter for pregnancy test, result negative: Secondary | ICD-10-CM | POA: Diagnosis not present

## 2020-10-28 DIAGNOSIS — J0300 Acute streptococcal tonsillitis, unspecified: Secondary | ICD-10-CM | POA: Diagnosis not present

## 2020-10-28 DIAGNOSIS — Z6825 Body mass index (BMI) 25.0-25.9, adult: Secondary | ICD-10-CM | POA: Diagnosis not present

## 2020-10-28 DIAGNOSIS — J3089 Other allergic rhinitis: Secondary | ICD-10-CM | POA: Diagnosis not present

## 2020-12-04 DIAGNOSIS — N760 Acute vaginitis: Secondary | ICD-10-CM | POA: Diagnosis not present

## 2020-12-04 DIAGNOSIS — Z118 Encounter for screening for other infectious and parasitic diseases: Secondary | ICD-10-CM | POA: Diagnosis not present

## 2021-02-04 DIAGNOSIS — Z113 Encounter for screening for infections with a predominantly sexual mode of transmission: Secondary | ICD-10-CM | POA: Diagnosis not present

## 2021-02-05 DIAGNOSIS — Z114 Encounter for screening for human immunodeficiency virus [HIV]: Secondary | ICD-10-CM | POA: Diagnosis not present

## 2021-02-05 DIAGNOSIS — Z113 Encounter for screening for infections with a predominantly sexual mode of transmission: Secondary | ICD-10-CM | POA: Diagnosis not present

## 2021-03-02 DIAGNOSIS — L305 Pityriasis alba: Secondary | ICD-10-CM | POA: Diagnosis not present

## 2021-03-02 DIAGNOSIS — L858 Other specified epidermal thickening: Secondary | ICD-10-CM | POA: Diagnosis not present

## 2021-05-03 ENCOUNTER — Encounter: Payer: Self-pay | Admitting: Nurse Practitioner

## 2021-05-03 ENCOUNTER — Ambulatory Visit: Payer: BC Managed Care – PPO | Admitting: Nurse Practitioner

## 2021-05-03 VITALS — BP 112/64 | HR 84 | Temp 98.9°F | Ht 64.8 in | Wt 153.0 lb

## 2021-05-03 DIAGNOSIS — R221 Localized swelling, mass and lump, neck: Secondary | ICD-10-CM

## 2021-05-03 DIAGNOSIS — R5383 Other fatigue: Secondary | ICD-10-CM | POA: Diagnosis not present

## 2021-05-03 DIAGNOSIS — R630 Anorexia: Secondary | ICD-10-CM

## 2021-05-03 DIAGNOSIS — F3289 Other specified depressive episodes: Secondary | ICD-10-CM | POA: Diagnosis not present

## 2021-05-03 DIAGNOSIS — R109 Unspecified abdominal pain: Secondary | ICD-10-CM | POA: Diagnosis not present

## 2021-05-03 DIAGNOSIS — Z800 Family history of malignant neoplasm of digestive organs: Secondary | ICD-10-CM

## 2021-05-03 DIAGNOSIS — Z809 Family history of malignant neoplasm, unspecified: Secondary | ICD-10-CM

## 2021-05-03 LAB — POCT URINALYSIS DIPSTICK
Bilirubin, UA: NEGATIVE
Glucose, UA: NEGATIVE
Ketones, UA: NEGATIVE
Leukocytes, UA: NEGATIVE
Nitrite, UA: NEGATIVE
Protein, UA: NEGATIVE
Spec Grav, UA: 1.02 (ref 1.010–1.025)
Urobilinogen, UA: 0.2 E.U./dL
pH, UA: 6 (ref 5.0–8.0)

## 2021-05-03 NOTE — Progress Notes (Signed)
I,Katawbba Wiggins,acting as a Education administrator for [employer], FNP.,have documented all relevant documentation on the behalf of Minette Brine, FNP,as directed by  Minette Brine, FNP while in the presence of Minette Brine, [place].   This visit occurred during the SARS-CoV-2 public health emergency.  Safety protocols were in place, including screening questions prior to the visit, additional usage of staff PPE, and extensive cleaning of exam room while observing appropriate contact time as indicated for disinfecting solutions.  Subjective:     Patient ID: Kristy Weber , female    DOB: 04-12-1996 , 25 y.o.   MRN: [id number]   Chief Complaint  Patient presents with   Flank Pain    HPI  The patient is here today for left side pain and to have her thyroid checked. She has had left side flank pain since January. She has seen urology for the left flank pain and was told everything was  okay.  Continues to have flank. LMP 04/28/2021.    Flank Pain This is a chronic problem. Pain location: flank. Pertinent negatives include no abdominal pain, bladder incontinence, dysuria, fever or numbness.    Past Medical History:  Diagnosis Date   Anemia    Figure skating (singles) (pairs)    Shingles      Family History  Problem Relation Age of Onset   Diabetes Mother    Sarcoidosis Mother    Diabetes Maternal Grandmother    Dementia Maternal Grandmother      Current Outpatient Medications:    ibuprofen (ADVIL) 600 MG tablet, Take 1 tablet (600 mg total) by mouth every 6 (six) hours as needed., Disp: 30 tablet, Rfl: 0   medroxyPROGESTERone (DEPO-PROVERA) 150 MG/ML injection, Inject 150 mg into the muscle every 3 (three) months., Disp: , Rfl:    montelukast (SINGULAIR) 10 MG tablet, Take 10 mg by mouth at bedtime., Disp: , Rfl:    No Known Allergies   Review of Systems  Constitutional:  Positive for appetite change and fatigue. Negative for fever.  HENT:         Feels like she has someone touching on  her throat.  Respiratory: Negative.  Negative for cough.   Cardiovascular:  Positive for palpitations (2 weeks ago).  Gastrointestinal:  Positive for nausea (2 weeks ago). Negative for abdominal pain.  Genitourinary:  Positive for flank pain. Negative for bladder incontinence and dysuria.  Neurological:  Negative for numbness.  Psychiatric/Behavioral: Negative.      Today's Vitals   05/03/21 1608  BP: 112/64  Pulse: 84  Temp: 98.9 F (37.2 C)  Weight: 153 lb (69.4 kg)  Height: 5' 4.8" (1.646 m)   Body mass index is 25.62 kg/m.  Wt Readings from Last 3 Encounters:  05/03/21 153 lb (69.4 kg)  12/24/19 150 lb 12.8 oz (68.4 kg)  09/24/19 155 lb 3.2 oz (70.4 kg)    BP Readings from Last 3 Encounters:  05/03/21 112/64  09/03/20 126/76  03/26/20 108/70    Objective:  Physical Exam Constitutional:      General: She is not in acute distress.    Appearance: Normal appearance.  Eyes:     Pupils: Pupils are equal, round, and reactive to light.  Neck:     Comments: She has neck fullness Cardiovascular:     Rate and Rhythm: Normal rate and regular rhythm.     Pulses: Normal pulses.     Heart sounds: Normal heart sounds. No murmur heard. Pulmonary:     Effort: Pulmonary effort is  normal. No respiratory distress.     Breath sounds: Normal breath sounds. No wheezing.  Abdominal:     Tenderness: There is no abdominal tenderness. There is left CVA tenderness. There is no right CVA tenderness.  Musculoskeletal:        General: No swelling or tenderness (posterior lower left rib area).  Skin:    Capillary Refill: Capillary refill takes less than 2 seconds.  Neurological:     General: No focal deficit present.     Mental Status: She is alert and oriented to person, place, and time.     Cranial Nerves: No cranial nerve deficit.     Motor: No weakness.  Psychiatric:        Mood and Affect: Mood normal.        Behavior: Behavior normal.        Thought Content: Thought content  normal.        Judgment: Judgment normal.        Assessment And Plan:     1. Decreased appetite Comments: This may be related to her being depressed but will check for metabolic causes - [id number]  2. Other depression Comments: Depression score is 36, she reports her mother was diagnosed cancer last year. She feels is colon cancer.   3. Other fatigue Comments: Will check metabolic causes vs depression - TSH - Vitamin B12  4. Flank pain Comments: Urinalysis is normal, will check rib xray since the pain is at the bottom of her ribs on left - DG Ribs Unilateral Left; Future - POCT Urinalysis Dipstick (81002)  5. Neck swelling Comments: She has neck swelling and a family history of thyroid proboems. Will check ultrasound of neck.  - US THYROID; Future  6. Family history of colon cancer  7. Family history of squamous cell carcinoma    Patient was given opportunity to ask questions. Patient verbalized understanding of the plan and was able to repeat key elements of the plan. All questions were answered to their satisfaction.  Minette Brine, FNP   I, Minette Brine, FNP, have reviewed all documentation for this visit. The documentation on 05/03/21 for the exam, diagnosis, procedures, and orders are all accurate and complete.   IF YOU HAVE BEEN REFERRED TO A SPECIALIST, IT MAY TAKE 1-2 WEEKS TO SCHEDULE/PROCESS THE REFERRAL. IF YOU HAVE NOT HEARD FROM US/SPECIALIST IN TWO WEEKS, PLEASE GIVE [place] A CALL AT [phone].   THE PATIENT IS ENCOURAGED TO PRACTICE SOCIAL DISTANCING DUE TO THE COVID-19 PANDEMIC.

## 2021-05-04 LAB — VITAMIN B12: Vitamin B-12: 297 pg/mL (ref 232–1245)

## 2021-05-04 LAB — CMP14+EGFR
ALT: 13 IU/L (ref 0–32)
AST: 21 IU/L (ref 0–40)
Albumin/Globulin Ratio: 2 (ref 1.2–2.2)
Albumin: 4.6 g/dL (ref 3.9–5.0)
Alkaline Phosphatase: 62 IU/L (ref 44–121)
BUN/Creatinine Ratio: 16 (ref 9–23)
BUN: 9 mg/dL (ref 6–20)
Bilirubin Total: 0.3 mg/dL (ref 0.0–1.2)
CO2: 22 mmol/L (ref 20–29)
Calcium: 9.7 mg/dL (ref 8.7–10.2)
Chloride: 100 mmol/L (ref 96–106)
Creatinine, Ser: 0.56 mg/dL — ABNORMAL LOW (ref 0.57–1.00)
Globulin, Total: 2.3 g/dL (ref 1.5–4.5)
Glucose: 92 mg/dL (ref 70–99)
Potassium: 4 mmol/L (ref 3.5–5.2)
Sodium: 137 mmol/L (ref 134–144)
Total Protein: 6.9 g/dL (ref 6.0–8.5)
eGFR: 131 mL/min/{1.73_m2} (ref >59)

## 2021-05-04 LAB — TSH: TSH: 1.14 u[IU]/mL (ref 0.450–4.500)

## 2021-05-13 ENCOUNTER — Ambulatory Visit
Admission: RE | Admit: 2021-05-13 | Discharge: 2021-05-13 | Disposition: A | Discharge status: Home / Self Care | Payer: BC Managed Care – PPO | Source: Ambulatory Visit | Attending: Nurse Practitioner | Admitting: Nurse Practitioner

## 2021-05-13 DIAGNOSIS — R109 Unspecified abdominal pain: Secondary | ICD-10-CM

## 2021-05-13 DIAGNOSIS — R0781 Pleurodynia: Secondary | ICD-10-CM | POA: Diagnosis not present

## 2021-05-13 DIAGNOSIS — R221 Localized swelling, mass and lump, neck: Secondary | ICD-10-CM

## 2021-05-13 DIAGNOSIS — E041 Nontoxic single thyroid nodule: Secondary | ICD-10-CM | POA: Diagnosis not present

## 2021-05-19 ENCOUNTER — Encounter: Payer: Self-pay | Admitting: Nurse Practitioner

## 2021-05-19 ENCOUNTER — Other Ambulatory Visit: Payer: Self-pay

## 2021-05-19 ENCOUNTER — Ambulatory Visit: Payer: BC Managed Care – PPO | Admitting: Nurse Practitioner

## 2021-05-19 VITALS — BP 118/76 | HR 85 | Temp 98.6°F | Ht 64.0 in | Wt 154.6 lb

## 2021-05-19 DIAGNOSIS — E041 Nontoxic single thyroid nodule: Secondary | ICD-10-CM | POA: Diagnosis not present

## 2021-05-19 DIAGNOSIS — R9389 Abnormal findings on diagnostic imaging of other specified body structures: Secondary | ICD-10-CM

## 2021-05-19 DIAGNOSIS — R109 Unspecified abdominal pain: Secondary | ICD-10-CM | POA: Diagnosis not present

## 2021-05-19 DIAGNOSIS — Z87448 Personal history of other diseases of urinary system: Secondary | ICD-10-CM

## 2021-05-19 NOTE — Patient Instructions (Signed)
Flank Pain, Adult ?Flank pain is pain in your side. The flank is the area on your side between your upper belly (abdomen) and your spine. The pain may occur over a short time (acute), or it may be long-term or come back often (chronic). It may be mild or very bad. Pain in this area can be caused by many different things. ?Follow these instructions at home: ? ?Drink enough fluid to keep your pee (urine) pale yellow. ?Rest as told by your doctor. ?Take over-the-counter and prescription medicines only as told by your doctor. ?Keep a journal to keep track of: ?What has caused your flank pain. ?What has made your flank pain feel better. ?Keep all follow-up visits. ?Contact a doctor if: ?Medicine does not help your pain. ?You have new symptoms. ?Your pain gets worse. ?Your symptoms last longer than 2-3 days. ?You have trouble peeing. ?You are peeing more often than normal. ?Get help right away if: ?You have trouble breathing. ?You are short of breath. ?Your belly hurts, or it is swollen or red. ?You feel like you may vomit (nauseous). ?You vomit. ?You feel faint, or you faint. ?You have blood in your pee. ?You have flank pain and a fever. ?These symptoms may be an emergency. Get help right away. Call your local emergency services (911 in the [place] ?Do not wait to see if the symptoms will go away. ?Do not drive yourself to the hospital. ?Summary ?Flank pain is pain in your side. The flank is the area of your side between your upper belly (abdomen) and your spine. ?Flank pain may occur over a short time (acute), or it may be long-term or come back often (chronic). It may be mild or very bad. ?Pain in this area can be caused by many different things. ?Contact your doctor if your symptoms get worse or last longer than 2-3 days. ?This information is not intended to replace advice given to you by your health care provider. Make sure you discuss any questions you have with your health care provider. ?Document Revised:  09/28/2020 Document Reviewed: 09/28/2020 ?Elsevier Patient Education ? 2022 Elsevier Inc. ? ?

## 2021-05-19 NOTE — Progress Notes (Signed)
  I,Rehema Muffley T Tyonna Talerico,acting as a Neurosurgeon for Arnette Felts, FNP.,have documented all relevant documentation on the behalf of Arnette Felts, FNP,as directed by  Arnette Felts, FNP while in the presence of Arnette Felts, FNP.  This visit occurred during the SARS-CoV-2 public health emergency.  Safety protocols were in place, including screening questions prior to the visit, additional usage of staff PPE, and extensive cleaning of exam room while observing appropriate contact time as indicated for disinfecting solutions.  Subjective:     Patient ID: Kristy Weber , female    DOB: [birth year] , 25 y.o.   MRN: [id number]   Chief Complaint  Patient presents with   Flank Pain    HPI  Pt here for side pain. She had an xray done which came back normal. She also had an ultrasound done with her thyroid. Pain radiates from chest to side, rates 8/10. She has been taking aleve OTC for pain which has not been helping.   Flank Pain    Past Medical History:  Diagnosis Date   Anemia    Figure skating (singles) (pairs)    Shingles      Family History  Problem Relation Age of Onset   Diabetes Mother    Sarcoidosis Mother    Diabetes Maternal Grandmother    Dementia Maternal Grandmother      Current Outpatient Medications:    ibuprofen (ADVIL) 600 MG tablet, Take 1 tablet (600 mg total) by mouth every 6 (six) hours as needed., Disp: 30 tablet, Rfl: 0   medroxyPROGESTERone (DEPO-PROVERA) 150 MG/ML injection, Inject 150 mg into the muscle every 3 (three) months., Disp: , Rfl:    montelukast (SINGULAIR) 10 MG tablet, Take 10 mg by mouth at bedtime., Disp: , Rfl:    No Known Allergies   Review of Systems  Constitutional: Negative.   Respiratory: Negative.    Cardiovascular: Negative.   Genitourinary:  Positive for flank pain.  Neurological: Negative.   Psychiatric/Behavioral: Negative.      Today's Vitals   05/19/21 1203  BP: 118/76  Pulse: 85  Temp: 98.6 F (37 C)  Weight: 154 lb 9.6  oz (70.1 kg)  Height: 5\' 4"  (1.626 m)  PainSc: 8    Body mass index is 26.54 kg/m.  Wt Readings from Last 3 Encounters:  05/19/21 154 lb 9.6 oz (70.1 kg)  05/03/21 153 lb (69.4 kg)  12/24/19 150 lb 12.8 oz (68.4 kg)    Objective:  Physical Exam      Assessment And Plan:     1. Flank pain    Patient was given opportunity to ask questions. Patient verbalized understanding of the plan and was able to repeat key elements of the plan. All questions were answered to their satisfaction.  12/26/19, CMA   I, Coolidge Breeze, CMA, have reviewed all documentation for this visit. The documentation on 05/19/21 for the exam, diagnosis, procedures, and orders are all accurate and complete.   IF YOU HAVE BEEN REFERRED TO A SPECIALIST, IT MAY TAKE 1-2 WEEKS TO SCHEDULE/PROCESS THE REFERRAL. IF YOU HAVE NOT HEARD FROM US/SPECIALIST IN TWO WEEKS, PLEASE GIVE 05/21/21 A CALL AT [phone].   THE PATIENT IS ENCOURAGED TO PRACTICE SOCIAL DISTANCING DUE TO THE COVID-19 PANDEMIC.

## 2021-05-19 NOTE — Progress Notes (Signed)
This visit occurred during the SARS-CoV-2 public health emergency.  Safety protocols were in place, including screening questions prior to the visit, additional usage of staff PPE, and extensive cleaning of exam room while observing appropriate contact time as indicated for disinfecting solutions.  Subjective:     Patient ID: Kristy Weber , female    DOB: [birth year] , 25 y.o.   MRN: [id number]   Chief Complaint  Patient presents with   Flank Pain    HPI  She has just stopped her birth control pills due to wanting take a break. She has been on birth control since the age of 42.  Pain started a week before stopping her birth control.     Past Medical History:  Diagnosis Date   Anemia    Figure skating (singles) (pairs)    Shingles      Family History  Problem Relation Age of Onset   Diabetes Mother    Sarcoidosis Mother    Diabetes Maternal Grandmother    Dementia Maternal Grandmother      Current Outpatient Medications:    ibuprofen (ADVIL) 600 MG tablet, Take 1 tablet (600 mg total) by mouth every 6 (six) hours as needed., Disp: 30 tablet, Rfl: 0   medroxyPROGESTERone (DEPO-PROVERA) 150 MG/ML injection, Inject 150 mg into the muscle every 3 (three) months., Disp: , Rfl:    montelukast (SINGULAIR) 10 MG tablet, Take 10 mg by mouth at bedtime., Disp: , Rfl:    No Known Allergies   Review of Systems  Constitutional: Negative.   Respiratory: Negative.    Cardiovascular: Negative.  Negative for chest pain, palpitations and leg swelling.  Psychiatric/Behavioral: Negative.      Today's Vitals   05/19/21 1203  BP: 118/76  Pulse: 85  Temp: 98.6 F (37 C)  Weight: 154 lb 9.6 oz (70.1 kg)  Height: 5\' 4"  (1.626 m)  PainSc: 8    Body mass index is 26.54 kg/m.   Objective:  Physical Exam Vitals reviewed.  Constitutional:      General: She is not in acute distress.    Appearance: Normal appearance.  Pulmonary:     Effort: Pulmonary effort is normal. No  respiratory distress.     Breath sounds: No wheezing.  Abdominal:     General: Abdomen is flat. There is no distension.     Tenderness: There is abdominal tenderness (left lateral mid quadrant radiating to flank).  Neurological:     General: No focal deficit present.     Mental Status: She is alert and oriented to person, place, and time.     Cranial Nerves: No cranial nerve deficit.     Motor: No weakness.  Psychiatric:        Mood and Affect: Mood normal.        Behavior: Behavior normal.        Thought Content: Thought content normal.        Judgment: Judgment normal.        Assessment And Plan:     1. Flank pain Comments: Worsening pain to left flank area, will check CT abdomen/pelvis - Lipase - Amylase - CT Abdomen Pelvis W Contrast; Future  2. Abdominal pain, unspecified abdominal location - Lipase - Amylase - CT Abdomen Pelvis W Contrast; Future  3. History of pyelonephritis  4. Abnormal thyroid ultrasound Comments: Revealed right thyroid nodule 1.1 cm but thought to be part of the parathyroid. Will check PTH and Intact Calcium  5. Right thyroid nodule - Parathyroid  Hormone, Intact w/Ca     Patient was given opportunity to ask questions. Patient verbalized understanding of the plan and was able to repeat key elements of the plan. All questions were answered to their satisfaction.  Arnette Felts, FNP   I, Arnette Felts, FNP, have reviewed all documentation for this visit. The documentation on 05/19/21 for the exam, diagnosis, procedures, and orders are all accurate and complete.   IF YOU HAVE BEEN REFERRED TO A SPECIALIST, IT MAY TAKE 1-2 WEEKS TO SCHEDULE/PROCESS THE REFERRAL. IF YOU HAVE NOT HEARD FROM US/SPECIALIST IN TWO WEEKS, PLEASE GIVE [place] A CALL AT [phone].   THE PATIENT IS ENCOURAGED TO PRACTICE SOCIAL DISTANCING DUE TO THE COVID-19 PANDEMIC.

## 2021-05-20 LAB — PTH, INTACT AND CALCIUM
Calcium: 9.4 mg/dL (ref 8.7–10.2)
PTH: 24 pg/mL (ref 15–65)

## 2021-05-20 LAB — LIPASE: Lipase: 66 U/L (ref 14–72)

## 2021-05-20 LAB — AMYLASE: Amylase: 90 U/L (ref 31–110)

## 2021-05-24 DIAGNOSIS — Z113 Encounter for screening for infections with a predominantly sexual mode of transmission: Secondary | ICD-10-CM | POA: Diagnosis not present

## 2021-05-24 DIAGNOSIS — Z114 Encounter for screening for human immunodeficiency virus [HIV]: Secondary | ICD-10-CM | POA: Diagnosis not present

## 2021-05-27 ENCOUNTER — Ambulatory Visit
Admission: RE | Admit: 2021-05-27 | Discharge: 2021-05-27 | Disposition: A | Discharge status: Home / Self Care | Payer: BC Managed Care – PPO | Source: Ambulatory Visit | Attending: Nurse Practitioner | Admitting: Nurse Practitioner

## 2021-05-27 ENCOUNTER — Other Ambulatory Visit: Payer: Self-pay

## 2021-05-27 DIAGNOSIS — R109 Unspecified abdominal pain: Secondary | ICD-10-CM

## 2021-05-27 MED ORDER — IOPAMIDOL (ISOVUE-300) INJECTION 61%
100.0000 mL | Freq: Once | INTRAVENOUS | Status: AC | PRN
  Administered 2021-05-27: 100 mL via INTRAVENOUS

## 2021-05-31 ENCOUNTER — Encounter: Payer: Self-pay | Admitting: Nurse Practitioner

## 2021-10-21 DIAGNOSIS — R300 Dysuria: Secondary | ICD-10-CM | POA: Diagnosis not present

## 2021-10-21 DIAGNOSIS — Z01419 Encounter for gynecological examination (general) (routine) without abnormal findings: Secondary | ICD-10-CM | POA: Diagnosis not present

## 2021-10-21 DIAGNOSIS — Z124 Encounter for screening for malignant neoplasm of cervix: Secondary | ICD-10-CM | POA: Diagnosis not present

## 2021-10-21 DIAGNOSIS — Z113 Encounter for screening for infections with a predominantly sexual mode of transmission: Secondary | ICD-10-CM | POA: Diagnosis not present

## 2021-10-21 DIAGNOSIS — Z0000 Encounter for general adult medical examination without abnormal findings: Secondary | ICD-10-CM | POA: Diagnosis not present
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
## 2022-05-18 DIAGNOSIS — J351 Hypertrophy of tonsils: Secondary | ICD-10-CM | POA: Diagnosis not present

## 2022-05-18 DIAGNOSIS — J358 Other chronic diseases of tonsils and adenoids: Secondary | ICD-10-CM | POA: Diagnosis not present
